# Patient Record
Sex: Male | Born: 1950 | Race: Black or African American | Hispanic: No | Smoking: Current every day smoker
Health system: Southern US, Community
[De-identification: ages and names within clinical notes are randomized; demographics above are authoritative.]

## PROBLEM LIST (undated history)

## (undated) DIAGNOSIS — I10 Essential (primary) hypertension: Secondary | ICD-10-CM

## (undated) DIAGNOSIS — B1920 Unspecified viral hepatitis C without hepatic coma: Secondary | ICD-10-CM

---

## 2019-06-30 ENCOUNTER — Encounter (HOSPITAL_COMMUNITY): Payer: Self-pay | Admitting: *Deleted

## 2019-06-30 ENCOUNTER — Other Ambulatory Visit: Payer: Self-pay

## 2019-06-30 ENCOUNTER — Emergency Department (HOSPITAL_COMMUNITY): Payer: Medicare Other

## 2019-06-30 ENCOUNTER — Emergency Department (HOSPITAL_COMMUNITY)
Admission: EM | Admit: 2019-06-30 | Discharge: 2019-07-01 | Disposition: A | Discharge status: Home / Self Care | Payer: Medicare Other | Attending: Emergency Medicine | Admitting: Emergency Medicine

## 2019-06-30 DIAGNOSIS — F17210 Nicotine dependence, cigarettes, uncomplicated: Secondary | ICD-10-CM | POA: Diagnosis not present

## 2019-06-30 DIAGNOSIS — I951 Orthostatic hypotension: Secondary | ICD-10-CM | POA: Diagnosis not present

## 2019-06-30 DIAGNOSIS — R42 Dizziness and giddiness: Secondary | ICD-10-CM | POA: Diagnosis present

## 2019-06-30 DIAGNOSIS — E860 Dehydration: Secondary | ICD-10-CM | POA: Diagnosis not present

## 2019-06-30 DIAGNOSIS — I10 Essential (primary) hypertension: Secondary | ICD-10-CM | POA: Diagnosis not present

## 2019-06-30 DIAGNOSIS — Z79899 Other long term (current) drug therapy: Secondary | ICD-10-CM | POA: Diagnosis not present

## 2019-06-30 HISTORY — DX: Essential (primary) hypertension: I10

## 2019-06-30 HISTORY — DX: Unspecified viral hepatitis C without hepatic coma: B19.20

## 2019-06-30 LAB — CBC WITH DIFFERENTIAL/PLATELET
Abs Immature Granulocytes: 0.03 10*3/uL (ref 0.00–0.07)
Basophils Absolute: 0 10*3/uL (ref 0.0–0.1)
Basophils Relative: 1 %
Eosinophils Absolute: 0.1 10*3/uL (ref 0.0–0.5)
Eosinophils Relative: 1 %
HCT: 51 % (ref 39.0–52.0)
Hemoglobin: 17.2 g/dL — ABNORMAL HIGH (ref 13.0–17.0)
Immature Granulocytes: 1 %
Lymphocytes Relative: 29 %
Lymphs Abs: 1.6 10*3/uL (ref 0.7–4.0)
MCH: 31.9 pg (ref 26.0–34.0)
MCHC: 33.7 g/dL (ref 30.0–36.0)
MCV: 94.4 fL (ref 80.0–100.0)
Monocytes Absolute: 0.9 10*3/uL (ref 0.1–1.0)
Monocytes Relative: 16 %
Neutro Abs: 3 10*3/uL (ref 1.7–7.7)
Neutrophils Relative %: 52 %
Platelets: 193 10*3/uL (ref 150–400)
RBC: 5.4 MIL/uL (ref 4.22–5.81)
RDW: 14.6 % (ref 11.5–15.5)
WBC: 5.6 10*3/uL (ref 4.0–10.5)
nRBC: 0 % (ref 0.0–0.2)

## 2019-06-30 LAB — COMPREHENSIVE METABOLIC PANEL
ALT: 64 U/L — ABNORMAL HIGH (ref 0–44)
AST: 74 U/L — ABNORMAL HIGH (ref 15–41)
Albumin: 4.1 g/dL (ref 3.5–5.0)
Alkaline Phosphatase: 52 U/L (ref 38–126)
Anion gap: 11 (ref 5–15)
BUN: 16 mg/dL (ref 8–23)
CO2: 21 mmol/L — ABNORMAL LOW (ref 22–32)
Calcium: 9.5 mg/dL (ref 8.9–10.3)
Chloride: 103 mmol/L (ref 98–111)
Creatinine, Ser: 1.67 mg/dL — ABNORMAL HIGH (ref 0.61–1.24)
GFR calc Af Amer: 49 mL/min — ABNORMAL LOW (ref >60)
GFR calc non Af Amer: 42 mL/min — ABNORMAL LOW (ref >60)
Glucose, Bld: 98 mg/dL (ref 70–99)
Potassium: 4.6 mmol/L (ref 3.5–5.1)
Sodium: 135 mmol/L (ref 135–145)
Total Bilirubin: 1.3 mg/dL — ABNORMAL HIGH (ref 0.3–1.2)
Total Protein: 8.5 g/dL — ABNORMAL HIGH (ref 6.5–8.1)

## 2019-06-30 LAB — RAPID URINE DRUG SCREEN, HOSP PERFORMED
Amphetamines: POSITIVE — AB
Barbiturates: NOT DETECTED
Benzodiazepines: NOT DETECTED
Cocaine: NOT DETECTED
Opiates: NOT DETECTED
Tetrahydrocannabinol: POSITIVE — AB

## 2019-06-30 LAB — PROTIME-INR
INR: 1 (ref 0.8–1.2)
Prothrombin Time: 12.9 seconds (ref 11.4–15.2)

## 2019-06-30 LAB — URINALYSIS, ROUTINE W REFLEX MICROSCOPIC
Bilirubin Urine: NEGATIVE
Glucose, UA: NEGATIVE mg/dL
Hgb urine dipstick: NEGATIVE
Ketones, ur: 5 mg/dL — AB
Nitrite: NEGATIVE
Protein, ur: NEGATIVE mg/dL
Specific Gravity, Urine: >1.046 — ABNORMAL HIGH (ref 1.005–1.030)
WBC, UA: >50 WBC/hpf — ABNORMAL HIGH (ref 0–5)
pH: 6 (ref 5.0–8.0)

## 2019-06-30 LAB — ETHANOL: Alcohol, Ethyl (B): <10 mg/dL (ref <10)

## 2019-06-30 LAB — CBG MONITORING, ED
Glucose-Capillary: 94 mg/dL (ref 70–99)
Glucose-Capillary: 79 mg/dL (ref 70–99)

## 2019-06-30 MED ORDER — SODIUM CHLORIDE 0.9 % IV BOLUS
1000.0000 mL | Freq: Once | INTRAVENOUS | Status: AC
  Administered 2019-06-30: 20:00:00 1000 mL via INTRAVENOUS

## 2019-06-30 MED ORDER — SODIUM CHLORIDE 0.9 % IV BOLUS
1000.0000 mL | Freq: Once | INTRAVENOUS | Status: AC
  Administered 2019-06-30: 21:00:00 1000 mL via INTRAVENOUS

## 2019-06-30 MED ORDER — IOHEXOL 350 MG/ML SOLN
100.0000 mL | Freq: Once | INTRAVENOUS | Status: AC | PRN
  Administered 2019-06-30: 22:00:00 100 mL via INTRAVENOUS

## 2019-06-30 MED ORDER — SODIUM CHLORIDE 0.9 % IV SOLN: INTRAVENOUS | Status: DC

## 2019-06-30 MED ORDER — SODIUM CHLORIDE (PF) 0.9 % IJ SOLN
INTRAMUSCULAR | Status: AC
  Filled 2019-06-30: qty 50

## 2019-06-30 NOTE — ED Notes (Signed)
Pt states that he is still unable to void. Urinal at the bedside

## 2019-06-30 NOTE — ED Notes (Signed)
Orthostatic VS per MD order. Unable to measure BP/pulse in standing position d/t pt c/o dizziness and inability to keep standing. Pt assisted back into laying position. Claiborne Billings RN advised. [employer]

## 2019-06-30 NOTE — ED Provider Notes (Signed)
[facility] DEPT Provider Note   CSN: [id number] Arrival date & time: 06/30/19  1902     History   Chief Complaint Chief Complaint  Patient presents with  . Dizziness    HPI Kevin Aguilar is a 68 y.o. male.     Pt presents to the ED today with dizziness.  Pt called EMS today because he has been really lightheaded and felt like he was going to pass out.  Pt's initial SBP was in the 70s.  The pt does have a hx of HTN, but did not take any of his meds today.  He has not been eating much because he has not had money to eat.  He denies cp, sob, or abd pain.      Past Medical History:  Diagnosis Date  . Hepatitis C   . Hypertension     There are no active problems to display for this patient.       Home Medications    Prior to Admission medications   Medication Sig Start Date End Date Taking? Authorizing Provider  amLODipine (NORVASC) 10 MG tablet Take 10 mg by mouth daily.   Yes [provider]  FLUoxetine (PROZAC) 20 MG tablet Take 20 mg by mouth 3 (three) times daily.   Yes [provider]    Family History No family history on file.  Social History Social History   Tobacco Use  . Smoking status: Current Every Day Smoker  Substance Use Topics  . Alcohol use: Not on file  . Drug use: Not on file     Allergies   Lisinopril   Review of Systems Review of Systems  Neurological: Positive for dizziness and light-headedness.  All other systems reviewed and are negative.    Physical Exam Updated Vital Signs BP (!) 127/99   Pulse 86   Temp 97.7 F (36.5 C) (Oral)   Resp (!) 23   SpO2 95%   Physical Exam Vitals signs and nursing note reviewed.  Constitutional:      Appearance: Normal appearance.  HENT:     Head: Normocephalic and atraumatic.     Right Ear: External ear normal.     Left Ear: External ear normal.     Nose: Nose normal.     Mouth/Throat:     Mouth: Mucous membranes are dry.  Eyes:      Extraocular Movements: Extraocular movements intact.     Conjunctiva/sclera: Conjunctivae normal.     Pupils: Pupils are equal, round, and reactive to light.  Neck:     Musculoskeletal: Normal range of motion and neck supple.  Cardiovascular:     Rate and Rhythm: Normal rate and regular rhythm.     Pulses: Normal pulses.     Heart sounds: Normal heart sounds.  Pulmonary:     Effort: Pulmonary effort is normal.     Breath sounds: Normal breath sounds.  Abdominal:     General: Abdomen is flat. Bowel sounds are normal.     Palpations: Abdomen is soft.  Musculoskeletal: Normal range of motion.  Skin:    General: Skin is warm.     Capillary Refill: Capillary refill takes less than 2 seconds.  Neurological:     General: No focal deficit present.     Mental Status: He is alert and oriented to person, place, and time.  Psychiatric:        Mood and Affect: Mood normal.        Behavior: Behavior normal.  Thought Content: Thought content normal.        Judgment: Judgment normal.      ED Treatments / Results  Labs (all labs ordered are listed, but only abnormal results are displayed) Labs Reviewed  CBC WITH DIFFERENTIAL/PLATELET - Abnormal; Notable for the following components:      Result Value   Hemoglobin 17.2 (*)    All other components within normal limits  COMPREHENSIVE METABOLIC PANEL - Abnormal; Notable for the following components:   CO2 21 (*)    Creatinine, Ser 1.67 (*)    Total Protein 8.5 (*)    AST 74 (*)    ALT 64 (*)    Total Bilirubin 1.3 (*)    GFR calc non Af Amer 42 (*)    GFR calc Af Amer 49 (*)    All other components within normal limits  PROTIME-INR  ETHANOL  URINALYSIS, ROUTINE W REFLEX MICROSCOPIC  RAPID URINE DRUG SCREEN, HOSP PERFORMED  CBG MONITORING, ED  CBG MONITORING, ED    EKG EKG Interpretation  Date/Time:  Friday June 30 2019 19:19:19 EDT Ventricular Rate:  96 PR Interval:    QRS Duration: 132 QT Interval:  392 QTC  Calculation: 496 R Axis:   -40 Text Interpretation:  Sinus arrhythmia Ventricular premature complex RBBB and LAFB ST elevation, consider lateral injury No old tracing to compare Confirmed by Jacalyn Lefevre [id number]) on 06/30/2019 10:31:45 PM   Radiology Dg Chest Portable 1 View  Result Date: 06/30/2019 CLINICAL DATA:  Syncope.  Dizziness and lightheadedness. EXAM: PORTABLE CHEST 1 VIEW COMPARISON:  None FINDINGS: Prominence of the ascending thoracic aorta, probably from tortuosity, ascending thoracic aortic aneurysm cannot be excluded. Heart size within normal limits. The lungs appear clear. No blunting of the costophrenic angles. IMPRESSION: 1. Prominence of the ascending thoracic aorta, probably from tortuosity. Strictly speaking an ascending aortic aneurysm cannot be excluded. 2. The lungs appear clear. Electronically Signed   By: Gaylyn Rong M.D.   On: 06/30/2019 20:14   Ct Angio Chest/abd/pel For Dissection W And/or Wo Contrast  Result Date: 06/30/2019 CLINICAL DATA:  Hypotension EXAM: CT ANGIOGRAPHY CHEST, ABDOMEN AND PELVIS TECHNIQUE: Multidetector CT imaging through the chest, abdomen and pelvis was performed using the standard protocol during bolus administration of intravenous contrast. Multiplanar reconstructed images and MIPs were obtained and reviewed to evaluate the vascular anatomy. CONTRAST:  OMNIPAQUE IOHEXOL 350 MG/ML SOLN COMPARISON:  None. FINDINGS: CTA CHEST FINDINGS Cardiovascular: --Heart: The heart size is mildly enlarged. There is nopericardial effusion. --Aorta: There is mild fusiform aneurysmal dilatation of the ascending aorta which measures 5.0 x 4.8 cm in maximal diameter which tapers at the level of the aortic arch. There is a mild amount of noncalcified plaque seen at the posterior intrathoracic descending aorta. Precontrast images show no aortic intramural hematoma. There is no blood pool, dissection or penetrating ulcer demonstrated on arterial phase  postcontrast imaging. There is a conventional 3 vessel aortic arch branching pattern. The proximal arch vessels are widely patent. --Pulmonary Arteries: Contrast timing is optimized for preferential opacification of the aorta. Within that limitation, normal central pulmonary arteries. Mediastinum/Nodes: No mediastinal, hilar or axillary lymphadenopathy. The visualized thyroid and thoracic esophageal course are unremarkable. Lungs/Pleura: No pulmonary nodules or masses. No pleural effusion or pneumothorax. No focal airspace consolidation. No focal pleural abnormality. Musculoskeletal: No chest wall abnormality. No acute osseous findings. Review of the MIP images confirms the above findings. CTA ABDOMEN AND PELVIS FINDINGS VASCULAR Aorta:Normal caliber aorta without aneurysm, dissection, vasculitis  or hemodynamically significant stenosis. There is no aortic atherosclerosis. Celiac: No aneurysm, dissection or hemodynamically significant stenosis. Normal branching pattern SMA: Widely patent without dissection or stenosis. Renals: Single renal arteries bilaterally. No aneurysm, dissection, stenosis or evidence of fibromuscular dysplasia. IMA: Patent without abnormality. Inflow: No aneurysm, stenosis or dissection. Veins: Normal course and caliber of the major veins. Assessment is otherwise limited by the arterial dominant contrast phase. Review of the MIP images confirms the above findings. NON-VASCULAR Hepatobiliary: Normal hepatic contours and density. No visible biliary dilatation. Normal gallbladder. Pancreas: Normal contours without ductal dilatation. No peripancreatic fluid collection. Spleen: Normal arterial phase splenic enhancement pattern. Adrenals/Urinary Tract: --Adrenal glands: Normal. --Right kidney/ureter: No hydronephrosis or perinephric stranding. No nephrolithiasis. No obstructing ureteral stones. --Left kidney/ureter: No hydronephrosis or perinephric stranding. No nephrolithiasis. No obstructing  ureteral stones. --Urinary bladder: Unremarkable. Stomach/Bowel: --Stomach/Duodenum: No hiatal hernia or other gastric abnormality. Normal duodenal course and caliber. --Small bowel: No dilatation or inflammation. --Colon: Scattered colonic diverticula are noted without diverticulitis. --Appendix: Normal. Lymphatic:  No abdominal or pelvic lymphadenopathy. Reproductive: No free fluid in the pelvis. Musculoskeletal. No bony spinal canal stenosis or focal osseous abnormality. Other: None. Review of the MIP images confirms the above findings. IMPRESSION: 1. 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 2. No other acute intrathoracic pathology. 6 3. Mild cardiomegaly. 4. No acute intra-abdominal or pelvic pathology. 5. Diverticulosis. Electronically Signed   By: Bindu  Avutu M.D.   Jonna Clarkn: 06/30/2019 22:11    Procedures Procedures (including critical care time)  Medications Ordered in ED Medications  sodium chloride 0.9 % bolus 1,000 mL (1,000 mLs Intravenous New Bag/Given 06/30/19 1944)    And  0.9 %  sodium chloride infusion (has no administration in time range)  sodium chloride (PF) 0.9 % injection (has no administration in time range)  sodium chloride 0.9 % bolus 1,000 mL (1,000 mLs Intravenous New Bag/Given 06/30/19 2105)  iohexol (OMNIPAQUE) 350 MG/ML injection 100 mL (100 mLs Intravenous Contrast Given 06/30/19 2138)     Initial Impression / Assessment and Plan / ED Course  I have reviewed the triage vital signs and the nursing notes.  Pertinent labs & imaging results that were available during my care of the patient were reviewed by me and considered in my medical decision making (see chart for details).       Pt is feeling much better.  The pt is aware of the thoracic aortic aneurysm.  He said it's followed at the [facility].  Pt has hep c which is why his lfts are elevated.  Pt is getting repeat orthostatics  and will be signed out to [facility] McDonald.  Final Clinical Impressions(s) / ED Diagnoses   Final diagnoses:  Orthostatic hypotension  Dehydration    ED Discharge Orders    None       Jacalyn LefevreHaviland, Dj Senteno, MD 06/30/19 2233

## 2019-06-30 NOTE — ED Provider Notes (Signed)
68 year old male received at signout from Dr. Gilford Raid pending repeat orthostatics and ambulation.   Per her HPI:  "Pt presents to the ED today with dizziness.  Pt called EMS today because he has been really lightheaded and felt like he was going to pass out.  Pt's initial SBP was in the 70s.  The pt does have a hx of HTN, but did not take any of his meds today.  He has not been eating much because he has not had money to eat.  He denies cp, sob, or abd pain."  Physical Exam  BP (!) 131/119 (BP Location: Left Arm)   Pulse 93   Temp 97.7 F (36.5 C) (Oral)   Resp 17   SpO2 98%   Physical Exam Vitals signs and nursing note reviewed.  Constitutional:      Appearance: He is well-developed.     Comments: No acute distress  HENT:     Head: Normocephalic.  Eyes:     Conjunctiva/sclera: Conjunctivae normal.  Neck:     Musculoskeletal: Neck supple.  Cardiovascular:     Rate and Rhythm: Normal rate and regular rhythm.     Heart sounds: No murmur.  Pulmonary:     Effort: Pulmonary effort is normal.  Abdominal:     General: There is no distension.     Palpations: Abdomen is soft.  Skin:    General: Skin is warm and dry.  Neurological:     Mental Status: He is alert.  Psychiatric:        Behavior: Behavior normal.     ED Course/Procedures     Procedures  MDM  68 year old male received at signout from Dr. Gilford Raid pending repeat orthostatic vital signs and ambulation.  In brief, the patient presented for lightheadedness and was hypotensive on arrival.  He had profound orthostatic hypotension on arrival.  He endorsed significant alcohol use, but otherwise poor PO intake.  He has a known thoracic aortic aneurysm that is unchanged from previous.  Please see Dr. Ayesha Rumpf note for further work-up and medical decision making.  Repeat orthostatic vital signs are negative.  Patient was ambulated without difficulty.  He reports that he is feeling markedly better and would like to be  discharged home.  He is established with the [facility] and plans to follow-up within the next week.  ER return precautions given.  He is hemodynamically stable in no acute distress.  Safe for discharge home with outpatient follow-up.   Joline Maxcy A, PA-C 07/01/19 0831    Isla Pence, MD 07/01/19 [phone]

## 2019-06-30 NOTE — ED Notes (Signed)
IV attempted x2 sites without success. Charge RN asked to give it a try.

## 2019-06-30 NOTE — ED Triage Notes (Signed)
Pt called EMS for dizziness and lightheadedness. EMS saw and pt initially refused transport to hospital. However symptoms worsened. EMS states that BP was 09M palp systolic however HR was 07-680 bpm. After "lying supine" BP increased to 100/74. Pt has hx of HTN however hasn't taken antihypertensive meds for the day.

## 2019-07-01 NOTE — Discharge Instructions (Signed)
Thank you for allowing me to care for you today in the Emergency Department.   [facility] within the next week.  Make sure that you are drinking at least 64 ounces of fluids daily.   Return to the emergency department if you fall, get extremely lightheaded, pass out, develop persistent vomiting, or other new, concerning symptoms.

## 2020-10-04 IMAGING — DX DG CHEST 1V PORT
2 series · 2 of 2 positions shown · non-contrast
Comparison: None

CLINICAL DATA: Syncope.  Dizziness and lightheadedness.

EXAM:
PORTABLE CHEST 1 VIEW

[chest ap (1 of 2)]
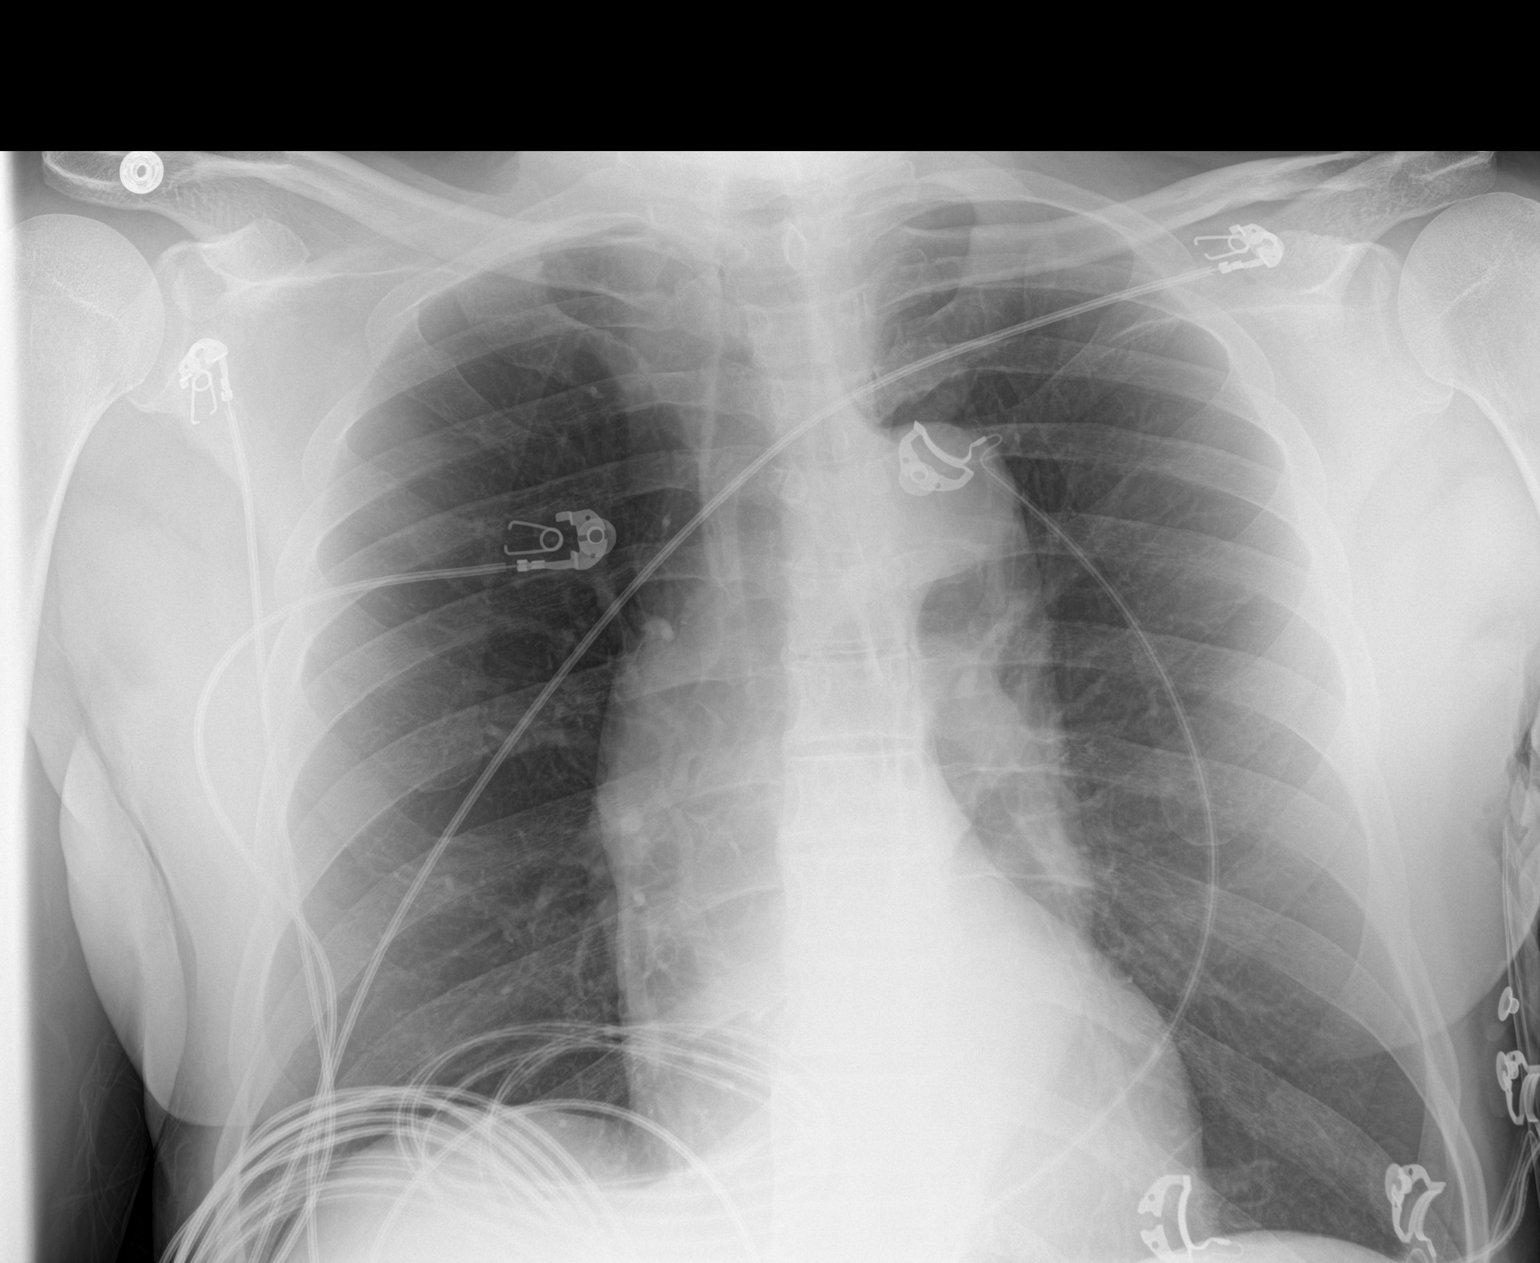

[chest ap (2 of 2)]
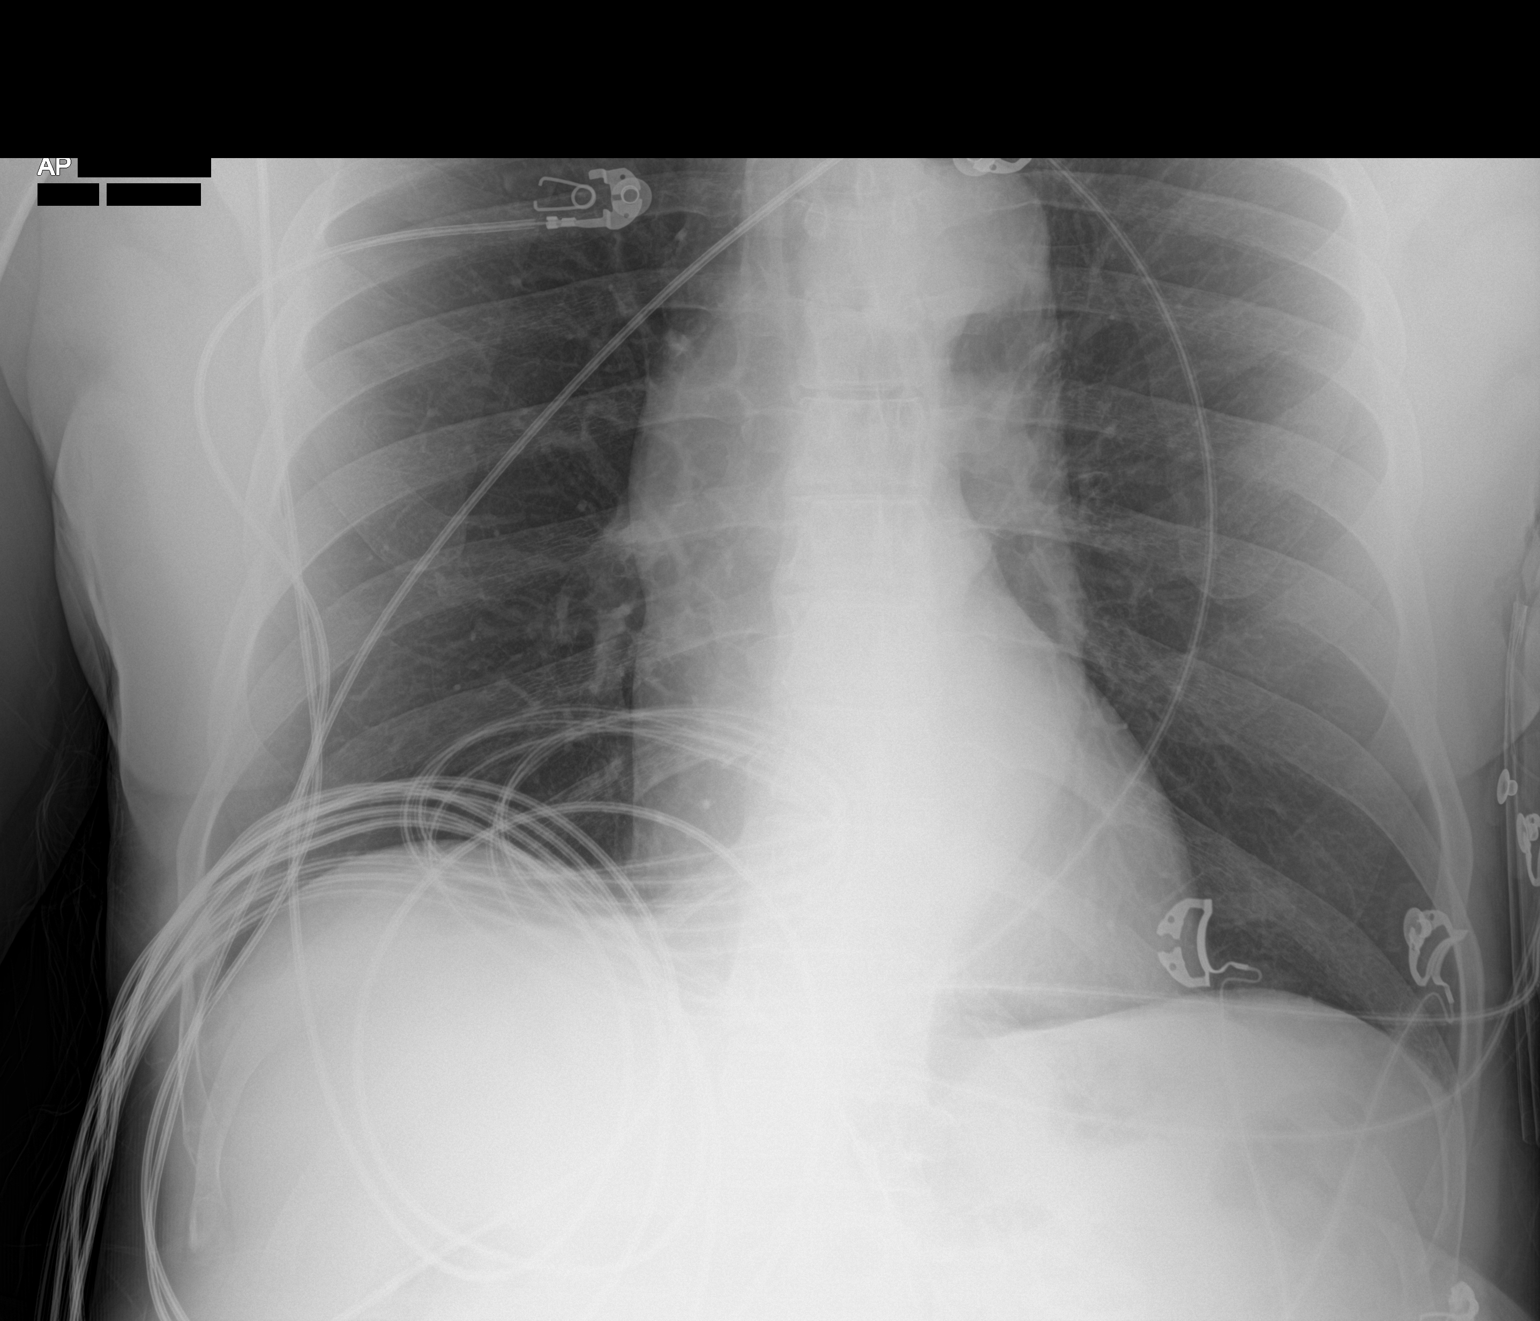

[2 of 2 positions shown; findings below may reference images not displayed]

FINDINGS: Prominence of the ascending thoracic aorta, probably from
tortuosity, ascending thoracic aortic aneurysm cannot be excluded.
Heart size within normal limits. The lungs appear clear. No blunting
of the costophrenic angles.
IMPRESSION: 1. Prominence of the ascending thoracic aorta, probably from
tortuosity. Strictly speaking an ascending aortic aneurysm cannot be
excluded.
2. The lungs appear clear.

## 2020-10-04 IMAGING — CT CT ANGIO CHEST-ABD-PELV FOR DISSECTION W/ AND WO/W CM
2 of 8 series · 13 of 46 positions shown, 15 images · IV contrast (omnipaque)
Comparison: None.

CLINICAL DATA: Hypotension

EXAM:
CT ANGIOGRAPHY CHEST, ABDOMEN AND PELVIS
TECHNIQUE: Multidetector CT imaging through the chest, abdomen and pelvis was
performed using the standard protocol during bolus administration of
intravenous contrast. Multiplanar reconstructed images and MIPs were
obtained and reviewed to evaluate the vascular anatomy.
CONTRAST:  100mL OMNIPAQUE IOHEXOL 350 MG/ML SOLN

[Series 5: axial arterial · axial · arterial · 0.71mm/px · z∈[-591,-54]mm · 10 of 219 slices shown, 12 images]
[im 20/219  soft-tissue]
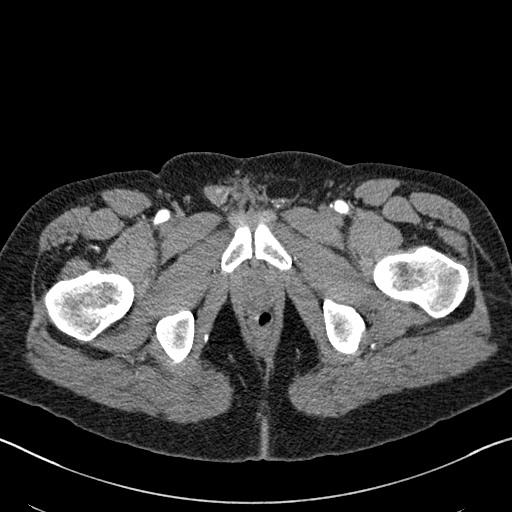
[im 20/219  bone]
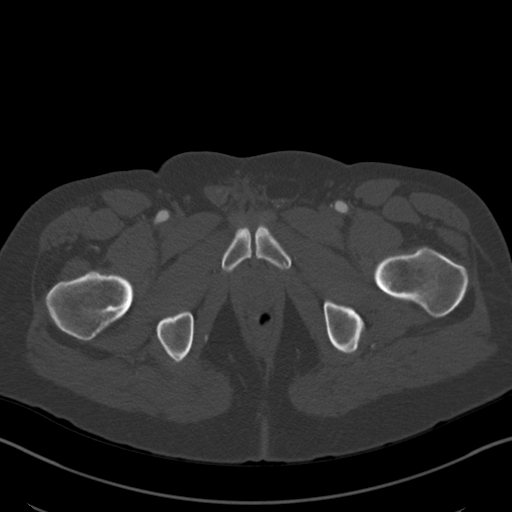
[im 40/219  soft-tissue]
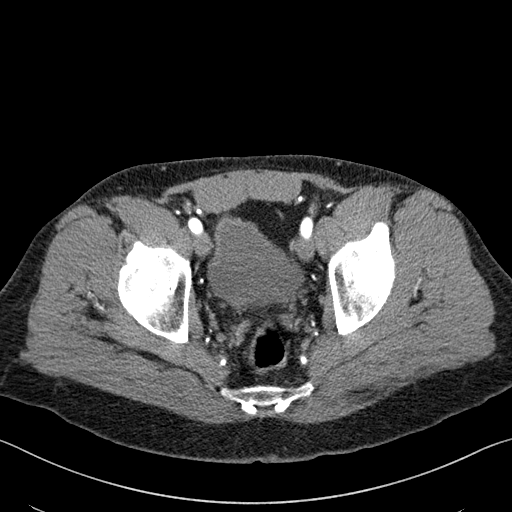
[im 60/219  soft-tissue]
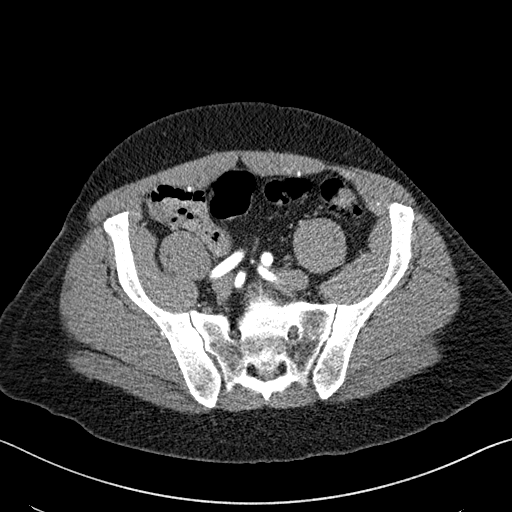
[im 80/219  soft-tissue]
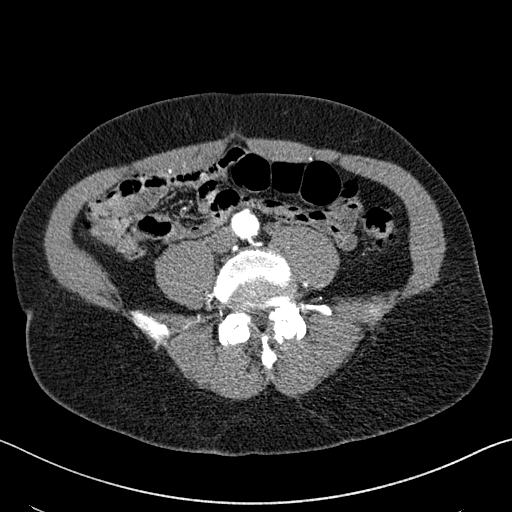
[im 100/219  soft-tissue]
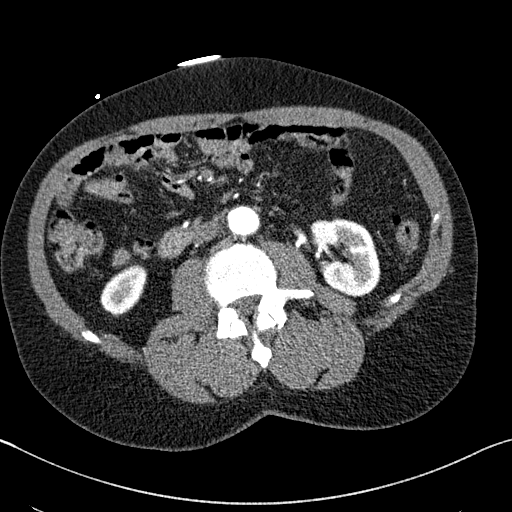
[im 119/219  soft-tissue]
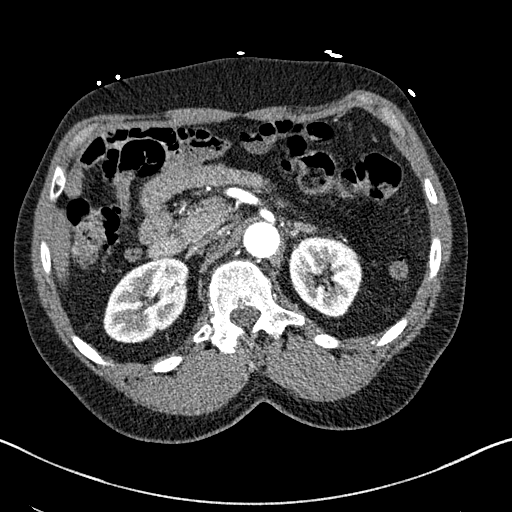
[im 139/219  soft-tissue]
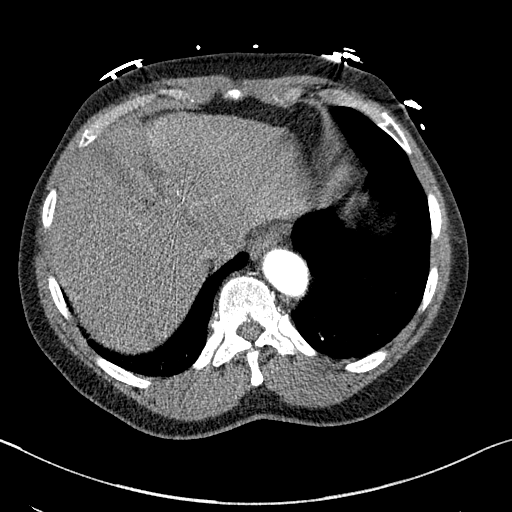
[im 159/219  soft-tissue]
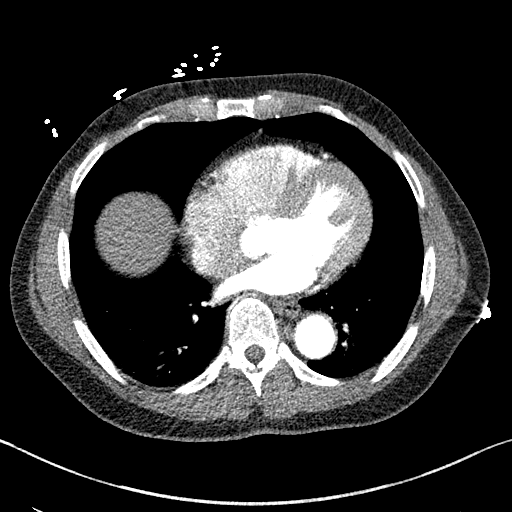
[im 179/219  soft-tissue]
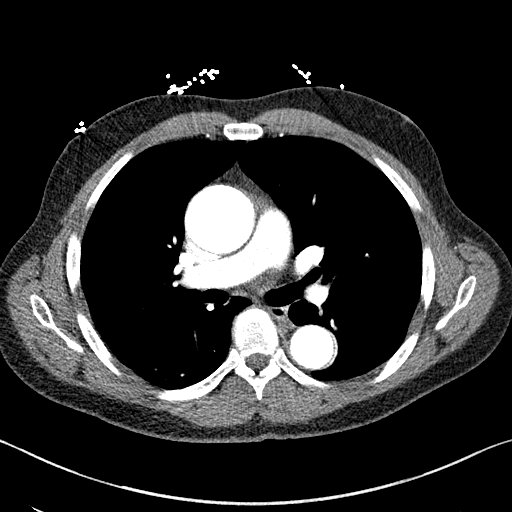
[im 179/219  bone]
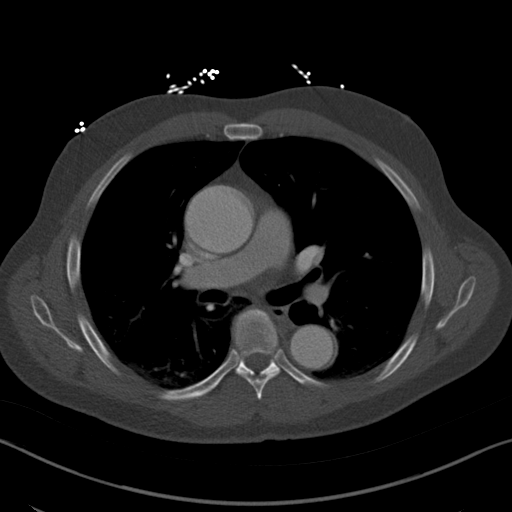
[im 199/219  soft-tissue]
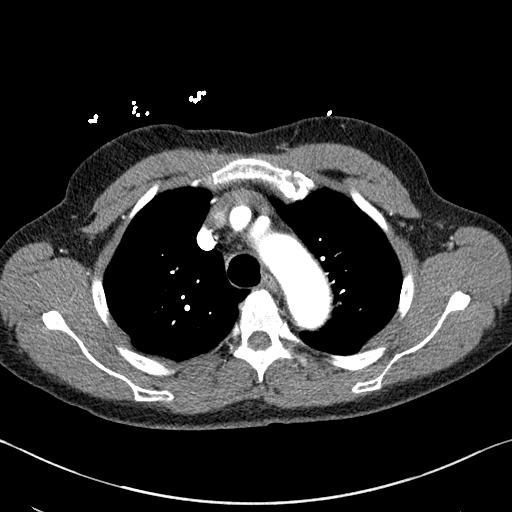

[Series 7: coronals · coronal · 0.71mm/px · 3 of 217 slices shown]
[im 44/217  soft-tissue]
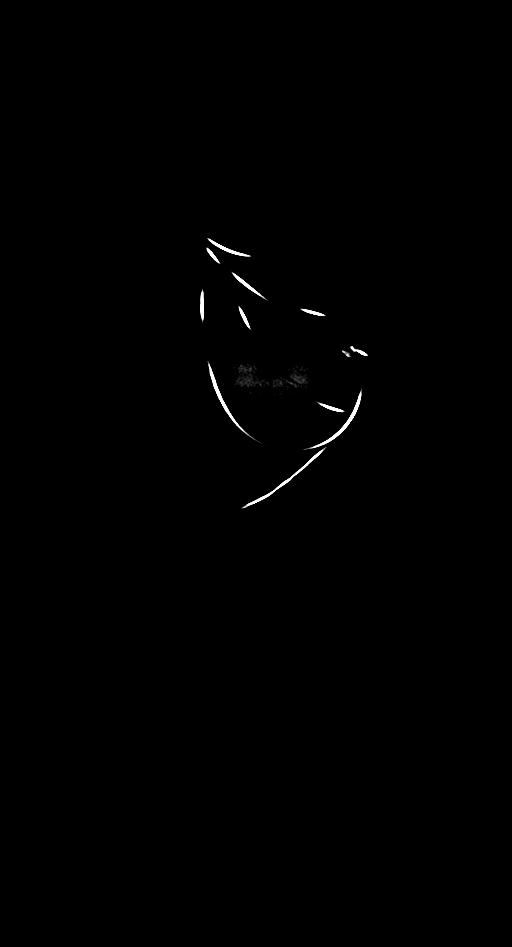
[im 87/217  soft-tissue]
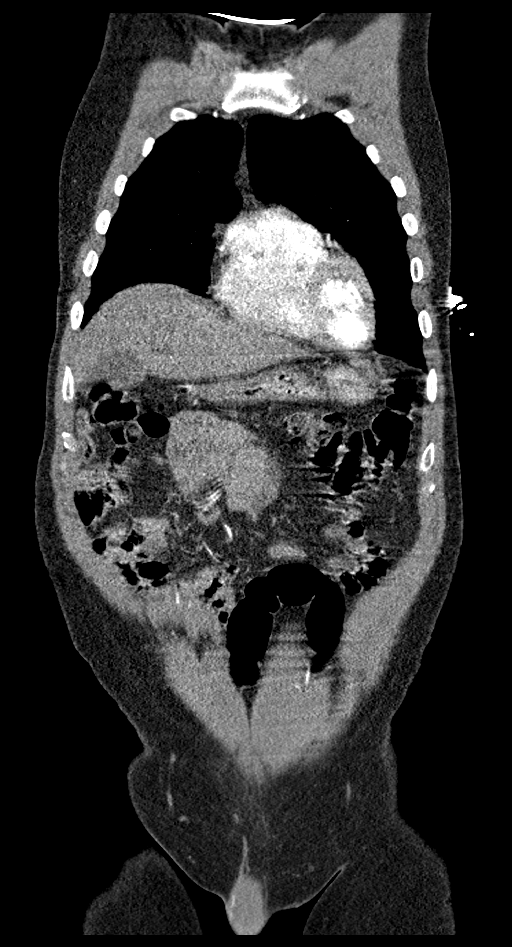
[im 130/217  soft-tissue]
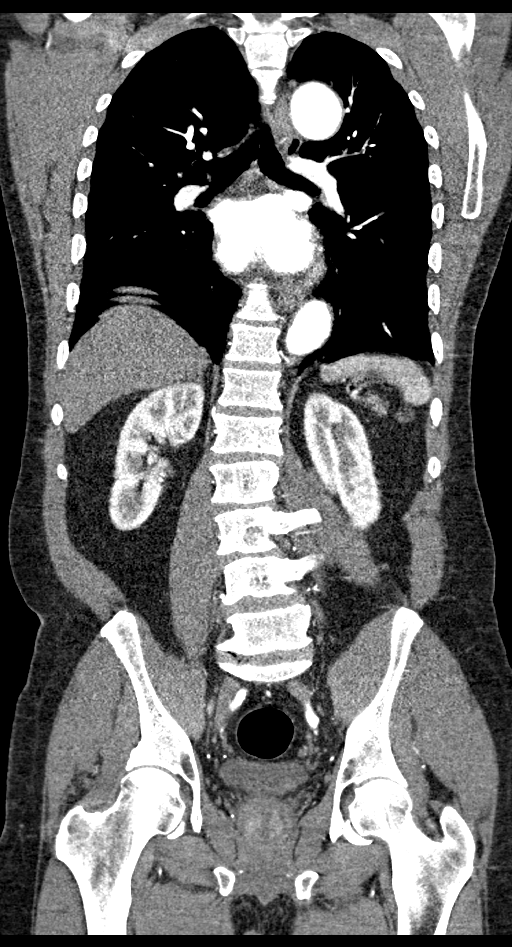

[13 of 46 positions shown; findings below may reference images not displayed]

FINDINGS: CTA CHEST FINDINGS

Cardiovascular:

--Heart: The heart size is mildly enlarged. There is nopericardial
effusion.

--Aorta: There is mild fusiform aneurysmal dilatation of the
ascending aorta which measures 5.0 x 4.8 cm in maximal diameter
which tapers at the level of the aortic arch. There is a mild amount
of noncalcified plaque seen at the posterior intrathoracic
descending aorta. Precontrast images show no aortic intramural
hematoma. There is no blood pool, dissection or penetrating ulcer
demonstrated on arterial phase postcontrast imaging. There is a
conventional 3 vessel aortic arch branching pattern. The proximal
arch vessels are widely patent.

--Pulmonary Arteries: Contrast timing is optimized for preferential
opacification of the aorta. Within that limitation, normal central
pulmonary arteries.

Mediastinum/Nodes: No mediastinal, hilar or axillary
lymphadenopathy. The visualized thyroid and thoracic esophageal
course are unremarkable.

Lungs/Pleura: No pulmonary nodules or masses. No pleural effusion or
pneumothorax. No focal airspace consolidation. No focal pleural
abnormality.

Musculoskeletal: No chest wall abnormality. No acute osseous
findings.

Review of the MIP images confirms the above findings.

CTA ABDOMEN AND PELVIS FINDINGS

VASCULAR

Aorta:Normal caliber aorta without aneurysm, dissection, vasculitis
or hemodynamically significant stenosis. There is no aortic
atherosclerosis.

Celiac: No aneurysm, dissection or hemodynamically significant
stenosis. Normal branching pattern

SMA: Widely patent without dissection or stenosis.

Renals: Single renal arteries bilaterally. No aneurysm, dissection,
stenosis or evidence of fibromuscular dysplasia.

IMA: Patent without abnormality.

Inflow: No aneurysm, stenosis or dissection.

Veins: Normal course and caliber of the major veins. Assessment is
otherwise limited by the arterial dominant contrast phase.

Review of the MIP images confirms the above findings.

NON-VASCULAR

Hepatobiliary: Normal hepatic contours and density. No visible
biliary dilatation. Normal gallbladder.

Pancreas: Normal contours without ductal dilatation. No
peripancreatic fluid collection.

Spleen: Normal arterial phase splenic enhancement pattern.

Adrenals/Urinary Tract:

--Adrenal glands: Normal.

--Right kidney/ureter: No hydronephrosis or perinephric stranding.
No nephrolithiasis. No obstructing ureteral stones.

--Left kidney/ureter: No hydronephrosis or perinephric stranding. No
nephrolithiasis. No obstructing ureteral stones.

--Urinary bladder: Unremarkable.

Stomach/Bowel:

--Stomach/Duodenum: No hiatal hernia or other gastric abnormality.
Normal duodenal course and caliber.

--Small bowel: No dilatation or inflammation.

--Colon: Scattered colonic diverticula are noted without
diverticulitis.

--Appendix: Normal.

Lymphatic:  No abdominal or pelvic lymphadenopathy.

Reproductive: No free fluid in the pelvis.

Musculoskeletal. No bony spinal canal stenosis or focal osseous
abnormality.

Other: None.

Review of the MIP images confirms the above findings.
IMPRESSION: 1. Fusiform ascending intrathoracic aortic aneurysm measuring 5.0 by
4.8 cm in maximum diameter which tapers at the level of the aortic
arch.
2. No other acute intrathoracic pathology. 6
3. Mild cardiomegaly.
4. No acute intra-abdominal or pelvic pathology.
5. Diverticulosis.
